# Patient Record
Sex: Female | Born: 1980 | Race: White | Hispanic: No | Marital: Married | Smoking: Current every day smoker
Health system: Southern US, Community
[De-identification: ages and names within clinical notes are randomized; demographics above are authoritative.]

## PROBLEM LIST (undated history)

## (undated) DIAGNOSIS — E0500 Thyrotoxicosis with diffuse goiter without thyrotoxic crisis or storm: Secondary | ICD-10-CM

## (undated) DIAGNOSIS — Z9689 Presence of other specified functional implants: Secondary | ICD-10-CM

## (undated) DIAGNOSIS — G40909 Epilepsy, unspecified, not intractable, without status epilepticus: Secondary | ICD-10-CM

## (undated) HISTORY — PX: TOE AMPUTATION: SHX809

---

## 2016-12-12 ENCOUNTER — Ambulatory Visit (INDEPENDENT_AMBULATORY_CARE_PROVIDER_SITE_OTHER): Payer: Worker's Compensation

## 2016-12-12 ENCOUNTER — Ambulatory Visit
Admission: EM | Admit: 2016-12-12 | Discharge: 2016-12-12 | Disposition: A | Discharge status: Home / Self Care | Payer: Worker's Compensation

## 2016-12-12 DIAGNOSIS — W228XXA Striking against or struck by other objects, initial encounter: Secondary | ICD-10-CM

## 2016-12-12 DIAGNOSIS — S9032XA Contusion of left foot, initial encounter: Secondary | ICD-10-CM

## 2016-12-12 HISTORY — DX: Epilepsy, unspecified, not intractable, without status epilepticus: G40.909

## 2016-12-12 HISTORY — DX: Presence of other specified functional implants: Z96.89

## 2016-12-12 HISTORY — DX: Thyrotoxicosis with diffuse goiter without thyrotoxic crisis or storm: E05.00

## 2016-12-12 MED ORDER — NAPROXEN 500 MG PO TABS: 500.0000 mg | ORAL_TABLET | Freq: Two times a day (BID) | ORAL | 0 refills | Status: AC

## 2016-12-12 NOTE — ED Provider Notes (Signed)
CSN: [id number]     Arrival date & time 12/12/16  1226 History   None    Chief Complaint  Patient presents with  . Foot Injury    left W/C   (Consider location/radiation/quality/duration/timing/severity/associated sxs/prior Treatment) HPI  This a 36 year old female who received 3 days prior to this visit dropped a pallet on her left foot dorsum. In the process of stacking empty pallets had the palate of noted when she lost control and it smashed onto her foot. Since that time she's had difficulty walking because of the pain in his walking mostly on her heel. 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      Past Medical History:  Diagnosis Date  . 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 Graves disease   . S/P placement of VNS (vagus nerve stimulation) device    Past Surgical History:  Procedure Laterality Date  . TOE AMPUTATION Left    Pinky   Family History  Problem Relation Age of Onset  . Thyroid disease Mother   . Alcohol abuse Mother    Social History  Substance Use Topics  . Smoking status: Current Every Day Smoker    Packs/day: 0.50    Types: Cigarettes  . Smokeless tobacco: Never Used  . Alcohol use No   OB History    No data available     Review of Systems  Constitutional: Positive for activity change. Negative for chills, fatigue and fever.  Musculoskeletal: Positive for myalgias.  All other systems reviewed and are negative.   Allergies  Zonegran [zonisamide] and Sulfa antibiotics  Home Medications   Prior to Admission medications   Medication Sig Start Date End Date Taking? Authorizing Provider  clonazePAM (KLONOPIN) 1 MG tablet Take 1 mg by mouth 2 (two) times daily.   Yes Historical Provider, MD  gabapentin (NEURONTIN) 400 MG capsule Take 400 mg by mouth at bedtime.   Yes Historical Provider, MD  QUEtiapine (SEROQUEL) 200 MG tablet Take 200 mg by mouth at bedtime.   Yes Historical Provider, MD  topiramate  (TOPAMAX) 25 MG tablet Take 25 mg by mouth 2 (two) times daily.   Yes Historical Provider, MD  naproxen (NAPROSYN) 500 MG tablet Take 1 tablet (500 mg total) by mouth 2 (two) times daily with a meal. 12/12/16   Lutricia Feil, PA-C   Meds Ordered and Administered this Visit  Medications - No data to display  BP 114/79 (BP Location: Left Arm)   Pulse 77   Temp 98 F (36.7 C) (Oral)   Resp 18   Ht  (1.727 m)   Wt 171 lb (77.6 kg)   LMP 11/25/2016   SpO2 100%   BMI 26.00 kg/m  No data found.   Physical Exam  Constitutional: She is oriented to person, place, and time. She appears well-developed and well-nourished. No distress.  HENT:  Head: Normocephalic and atraumatic.  Eyes: Pupils are equal, round, and reactive to light.  Neck: Normal range of motion.  Musculoskeletal: She exhibits tenderness. She exhibits no edema or deformity.  Examination of the left foot shows no ecchymosis no swelling or. There is some small abrasion over the midportion of the first metatarsal. There is no induration and no erythema in this area. She has a surgically absent fifth toe. She has full range of motion of her ankle. She has no calcaneal pressure pain. This is sharply localized over the first  metatarsal from the midshaft distally MTP head.  Neurological: She is alert and oriented to person, place, and time.  Skin: Skin is warm and dry. She is not diaphoretic.  Psychiatric: She has a normal mood and affect. Her behavior is normal. Judgment and thought content normal.  Nursing note and vitals reviewed.   Urgent Care Course     Procedures (including critical care time)  Labs Review Labs Reviewed - No data to display  Imaging Review Dg Foot Complete Left  Result Date: 12/12/2016 CLINICAL DATA:  Dropped wooden Pallet from 4 foot height onto foot. Left foot pain and swelling. Initial encounter. EXAM: LEFT FOOT - COMPLETE 3+ VIEW COMPARISON:  None. FINDINGS: There is no evidence of fracture or  dislocation. There is no evidence of arthropathy or other focal bone lesions. 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 Small plantar calcaneal bone spur incidentally noted. Soft tissues are unremarkable. IMPRESSION: No acute findings.  Previous fifth toe amputation. Electronically Signed   By: Myles Rosenthal M.D.   On: 12/12/2016 13:07     Visual Acuity Review  Right Eye Distance:   Left Eye Distance:   Bilateral Distance:    Right Eye Near:   Left Eye Near:    Bilateral Near:         MDM   1. Contusion of left foot, initial encounter    Discharge Medication List as of 12/12/2016  1:19 PM    START taking these medications   Details  naproxen (NAPROSYN) 500 MG tablet Take 1 tablet (500 mg total) by mouth 2 (two) times daily with a meal., Starting Sun 12/12/2016, Normal      Plan: 1. Test/x-ray results and diagnosis reviewed with patient 2. rx as per orders; risks, benefits, potential side effects reviewed with patient 3. Recommend supportive treatment with Rest and symptom avoidance. Elevate the foot is much as feasible. Continue to use ice 20 minutes out of every 2 hours 3-4 times daily. Use of the boot orthosis for activities requiring ambulation or standing. May remove the boot at nighttime. Follow up with orthopedic surgeon if not improving.  4. F/u prn if symptoms worsen or don't improve     Lutricia Feil, PA-C 12/12/16 1330

## 2016-12-12 NOTE — ED Triage Notes (Signed)
Pt dropped a pallet on her left foot on  Thursday morning.

## 2016-12-29 ENCOUNTER — Encounter: Payer: Self-pay | Admitting: Podiatry

## 2016-12-29 ENCOUNTER — Ambulatory Visit: Payer: Managed Care, Other (non HMO)

## 2016-12-29 ENCOUNTER — Ambulatory Visit (INDEPENDENT_AMBULATORY_CARE_PROVIDER_SITE_OTHER): Payer: Worker's Compensation | Admitting: Podiatry

## 2016-12-29 DIAGNOSIS — S99922A Unspecified injury of left foot, initial encounter: Secondary | ICD-10-CM

## 2016-12-29 DIAGNOSIS — R203 Hyperesthesia: Secondary | ICD-10-CM

## 2016-12-30 NOTE — Progress Notes (Signed)
   HPI:  36 year old f49emale presents to the office today for evaluation of left great toe pain. Patient states that on 12/09/2016 she had a palate fall on her foot. Patient had x-rays done at the emergency department which were negative for fracture. Immobilization cam boot provided. Patient cannot stand for long periods of time without the cam boot. Patient presents today for follow-up treatment and evaluation    Physical Exam: General: The patient is alert and oriented x3 in no acute distress.  Dermatology: Hyperesthesia noted to light touch along the first metatarsal phalangeal joint and distal half of the first metatarsal. Negative for open lacerations or lesions.  Vascular: Palpable pedal pulses bilaterally. No edema or erythema noted. Capillary refill within normal limits.  Neurological: Hyperesthesia noted to light touch along the first metatarsal phalangeal joint left foot  Musculoskeletal Exam: Unable to perform musculoskeletal exam and range of motion of the first MPJ due to hypersensitivity  Radiographic Exam:  Normal osseous mineralization. Joint spaces preserved. No fracture/dislocation/boney destruction.    Assessment: 1. Hyperesthesia left great toe 2. History of trauma left great toe   Plan of Care:  1. Patient was evaluated. 2. Due to the hypersensitivity and pain today were going to order a CT with contrast. Patient is unable to have an MRI performed due to vagal nerve stimulator. 3. Return to clinic in 3 weeks to review CT results and discuss further treatment options. We may need to rule out CRPS. The pain appears to be disproportionate to the history of trauma and clinical appearance of the foot.   Felecia Shelling, DPM [facility]  Dr. Felecia Shelling, DPM    [address]                                        [place], [place] [address]                Office [phone]  Fax [phone]

## 2016-12-31 ENCOUNTER — Telehealth: Payer: Self-pay | Admitting: *Deleted

## 2016-12-31 DIAGNOSIS — S99922A Unspecified injury of left foot, initial encounter: Secondary | ICD-10-CM

## 2016-12-31 DIAGNOSIS — R203 Hyperesthesia: Secondary | ICD-10-CM

## 2016-12-31 NOTE — Telephone Encounter (Addendum)
-----   Message from Felecia Shelling, DPM sent at 12/30/2016  4:48 PM EDT ----- Regarding: CT w/ contrast Please order CT with contrast left foot. Patient is unable to have an MRI due to a vagal nerve stimulator.  Diagnoses: Hyperesthesia left great toe. History of trauma left great toe. Pain out of proportion to clinical findings left foot.  Thanks, Dr. Logan Bores.12/31/2016-Contacted pt's Workers' Physicist, medical [employer] c/o Linden A. Rodney Langton., answering service gave fax [phone]. Faxed copy of pt's Workers Compensation - First Report of Injury or Illness form, clinical 12/29/2016, with cover sheet with [phone], and fax [phone] Attn: Joya San.01/04/2017-Pt left message but was difficult to understand what she was wanting. Left message to call again. Pt called again and states needs updated restrictions for form. 01/05/2017-Dr. Earlean Shawl [phone] states he needs a Peer to Peer with Dr. Logan Bores concerning pt's CT with contrast. Pt left name and phone number only. 01/06/2017-I spoke with pt and she stated she spoke with someone and they were looking at getting Dr.Evans to write a note with updated restrictions. I told her that if she left a message again to let me know what she wanted and I could get that sent to the person that took care of those request. 01/06/2017-Amy - Henrene Dodge state pt has been certified - Reference# [id number] for CT with Contrast, and certified - Reference# [id number] for 3 week follow up appt, and pt's Workers Comp. Adjuster Tyler Aas [phone]. Left message informing Tyler Aas, our office had received the certifications for the CT and the follow-up visit, but would like to know if her agency had a radiology group they preferred to use, to please call so I could order the CT. [employer] risk analyst, Adjuster called states pt's Claim# [id number]/07/2017-I spoke with Tyler Aas, Adjuster and she states they use [facility] for radiology  orders [phone], will need pt's SS# [id number], pt's DOB and Adjuster's name. Faxed orders to [facility] for CT with contrast of left great toe.01/10/2017-Mark - [facility] states pt is scheduled for CT on 01/14/2017.01/11/2017-Received fax requesting orders for CT to be faxed to [facility], [address] [place], [place] [address], [phone], f [phone]. Copy of orders faxed to [facility].

## 2017-01-06 ENCOUNTER — Encounter: Payer: Self-pay | Admitting: Podiatry

## 2017-01-25 ENCOUNTER — Ambulatory Visit (INDEPENDENT_AMBULATORY_CARE_PROVIDER_SITE_OTHER): Payer: Worker's Compensation | Admitting: Podiatry

## 2017-01-25 DIAGNOSIS — M7752 Other enthesopathy of left foot: Secondary | ICD-10-CM | POA: Diagnosis not present

## 2017-01-25 MED ORDER — METHYLPREDNISOLONE 4 MG PO TBPK: ORAL_TABLET | ORAL | 0 refills | Status: AC

## 2017-01-25 MED ORDER — DICLOFENAC SODIUM 75 MG PO TBEC: 75.0000 mg | DELAYED_RELEASE_TABLET | Freq: Two times a day (BID) | ORAL | 0 refills | Status: AC

## 2017-01-26 NOTE — Progress Notes (Signed)
   HPI:  36 year old female presents to the office today for follow up evaluation of left great toe pain. She states her pain is the same. She denies any new trauma, injury or fall. She has no new complaints.   Physical Exam: General: The patient is alert and oriented x3 in no acute distress.  Dermatology: Hyperesthesia noted to light touch along the first metatarsal phalangeal joint and distal half of the first metatarsal. Negative for open lacerations or lesions.  Vascular: Palpable pedal pulses bilaterally. No edema or erythema noted. Capillary refill within normal limits.  Neurological: Hyperesthesia noted to light touch along the first metatarsal phalangeal joint left foot  Musculoskeletal Exam: Today there is pain on palpation and range of motion of the first MTPJ left foot consistent with a joint capsulitis.  Assessment: 1. Capsulitis 1st MPJ left   Plan of Care:  1. Patient was evaluated. CT scan reviewed.  2. Injection of 0.5 mLs Celestone Soluspan injected into 1st MPJ of the left foot. 3. Dancer's pads dispensed.  4. Continue wearing post op shoe. 5. Prescription for Medrol Dose Pak given to patient. 6. Prescription for Diclofenac 75 mg given to patient. 7. Return to clinic in 4 weeks.   Felecia ShellingBrent M. Ernesha Ramone, DPM [facility]  Dr. Felecia ShellingBrent M. Terrell Ostrand, DPM    [address]                                        [place], [place] [address]                Office [phone]  Fax [phone]

## 2017-01-28 MED ORDER — BETAMETHASONE SOD PHOS & ACET 6 (3-3) MG/ML IJ SUSP: 3.0000 mg | Freq: Once | INTRAMUSCULAR | Status: AC

## 2017-02-08 ENCOUNTER — Encounter: Payer: Self-pay | Admitting: Podiatry

## 2017-02-25 ENCOUNTER — Ambulatory Visit (INDEPENDENT_AMBULATORY_CARE_PROVIDER_SITE_OTHER): Payer: Worker's Compensation | Admitting: Podiatry

## 2017-02-25 DIAGNOSIS — M2580 Other specified joint disorders, unspecified joint: Secondary | ICD-10-CM | POA: Diagnosis not present

## 2017-02-25 DIAGNOSIS — M7752 Other enthesopathy of left foot: Secondary | ICD-10-CM | POA: Diagnosis not present

## 2017-03-01 NOTE — Progress Notes (Signed)
   HPI:  36 year old female presents to the office today for follow up evaluation of left great toe pain. She states her pain has improved but still reports some pain depending on the shoes she is wearing. She has been taking diclofenac which helps alleviate the pain. She reports significant improvement after receiving the injection. She denies any new complaints at this time.   Physical Exam: General: The patient is alert and oriented x3 in no acute distress.  Dermatology: Hyperesthesia noted to light touch along the first metatarsal phalangeal joint and distal half of the first metatarsal. Negative for open lacerations or lesions.  Vascular: Palpable pedal pulses bilaterally. No edema or erythema noted. Capillary refill within normal limits.  Neurological: Hyperesthesia noted to light touch along the first metatarsal phalangeal joint left foot  Musculoskeletal Exam: Today there is pain on palpation and range of motion of the first MTPJ left foot consistent with a joint capsulitis.  Assessment: 1. Capsulitis 1st MPJ left 2. Sesamoiditis left   Plan of Care:  1. Patient was evaluated. 2. Injection of 0.5 mLs Celestone Soluspan injected into 1st MPJ of the left foot. 3. Appointment with Raiford Nobleick for custom molded orthotics with Morton's extension. 4. Return to clinic in 6 weeks.   Felecia ShellingBrent M. Claudina Oliphant, DPM [facility]  Dr. Felecia ShellingBrent M. Hilari Wethington, DPM    [address]                                        [place], [place] [address]                Office [phone]  Fax [phone]

## 2017-03-03 MED ORDER — BETAMETHASONE SOD PHOS & ACET 6 (3-3) MG/ML IJ SUSP: 3.0000 mg | Freq: Once | INTRAMUSCULAR | Status: AC

## 2017-03-23 ENCOUNTER — Ambulatory Visit: Payer: Self-pay

## 2017-12-28 IMAGING — CR DG FOOT COMPLETE 3+V*L*
3 series · 3 of 3 positions shown · non-contrast
Comparison: None.

CLINICAL DATA: Dropped Cho Con Pallet from 4 foot height onto foot.
Left foot pain and swelling. Initial encounter.

EXAM:
LEFT FOOT - COMPLETE 3+ VIEW

[foot ap]
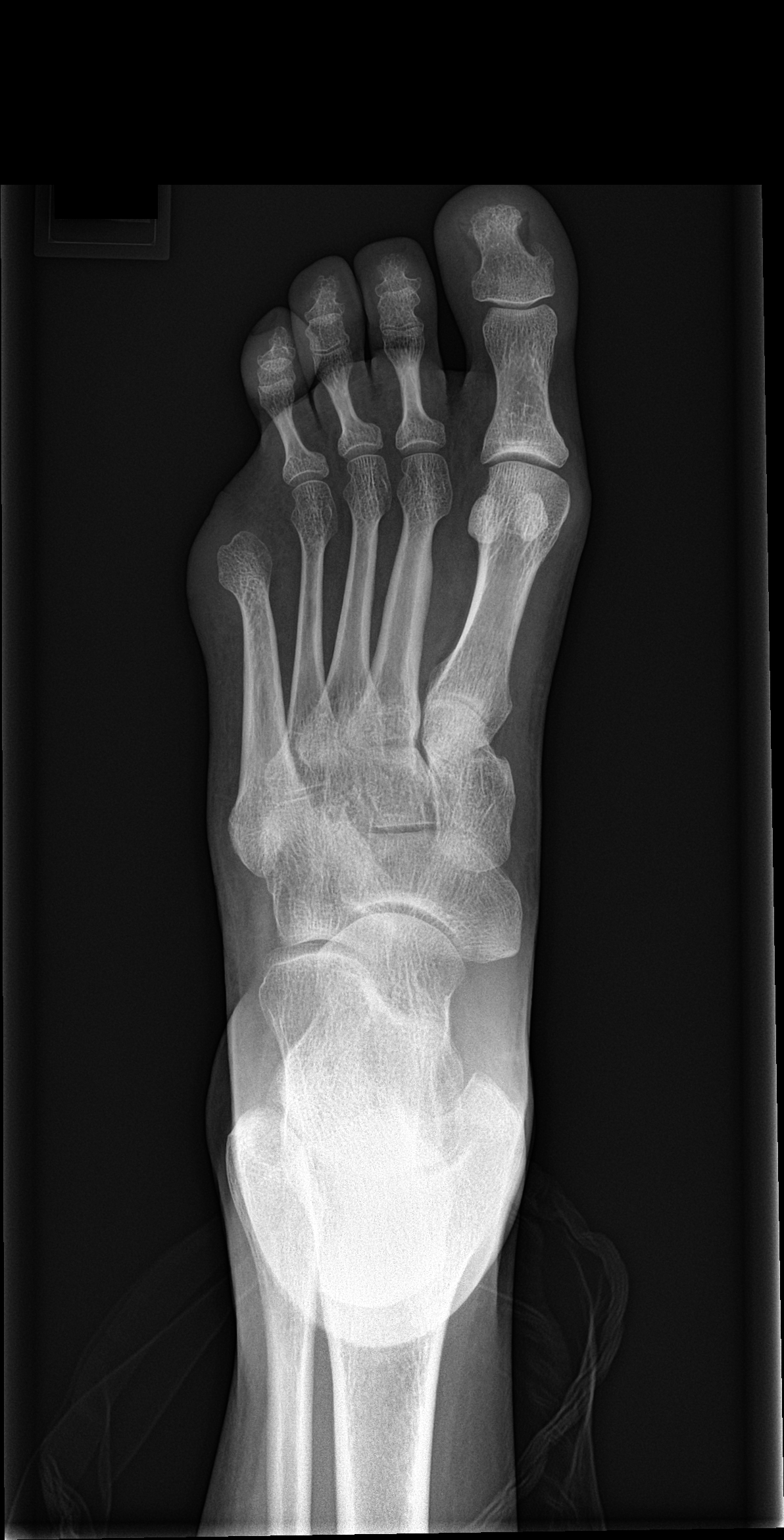

[foot obl]
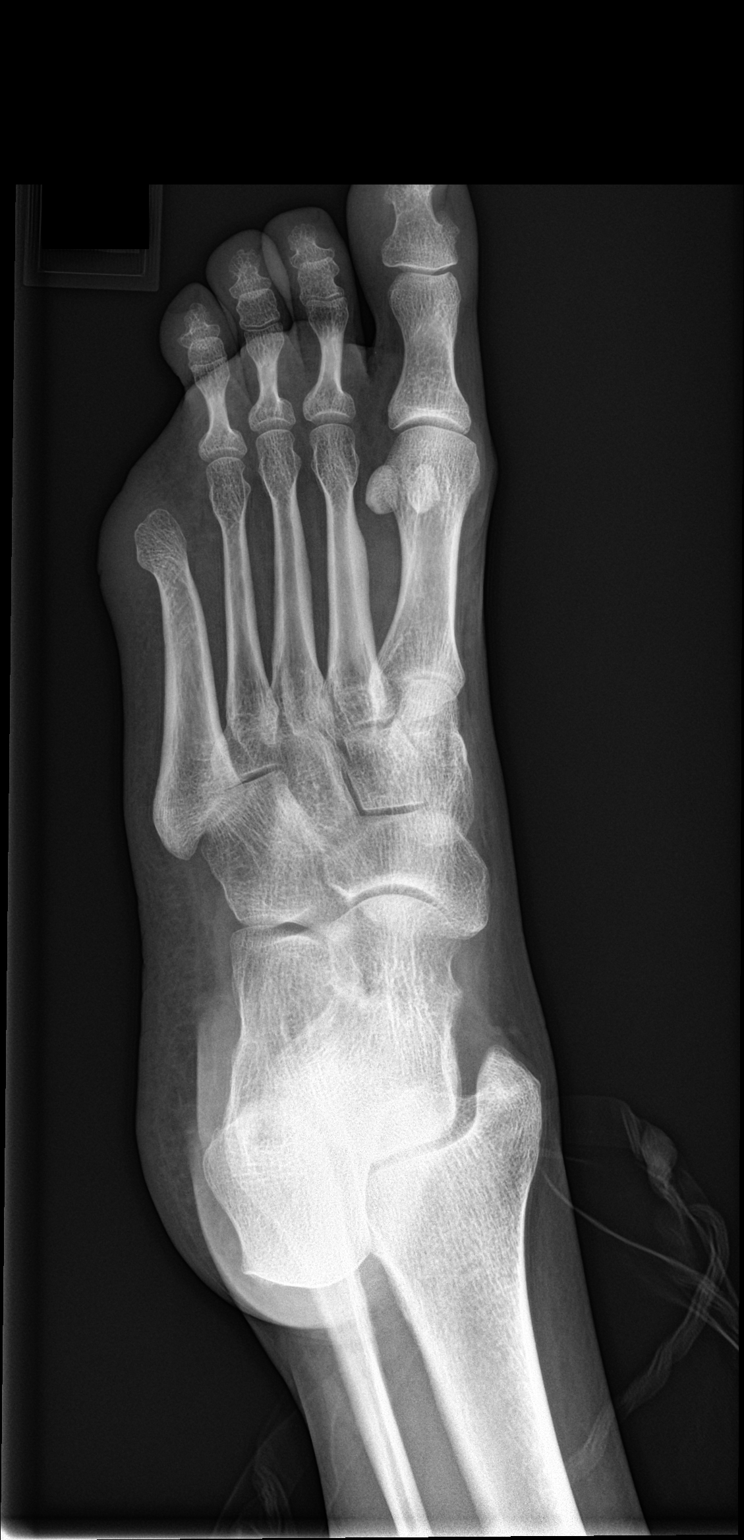

[foot lat]
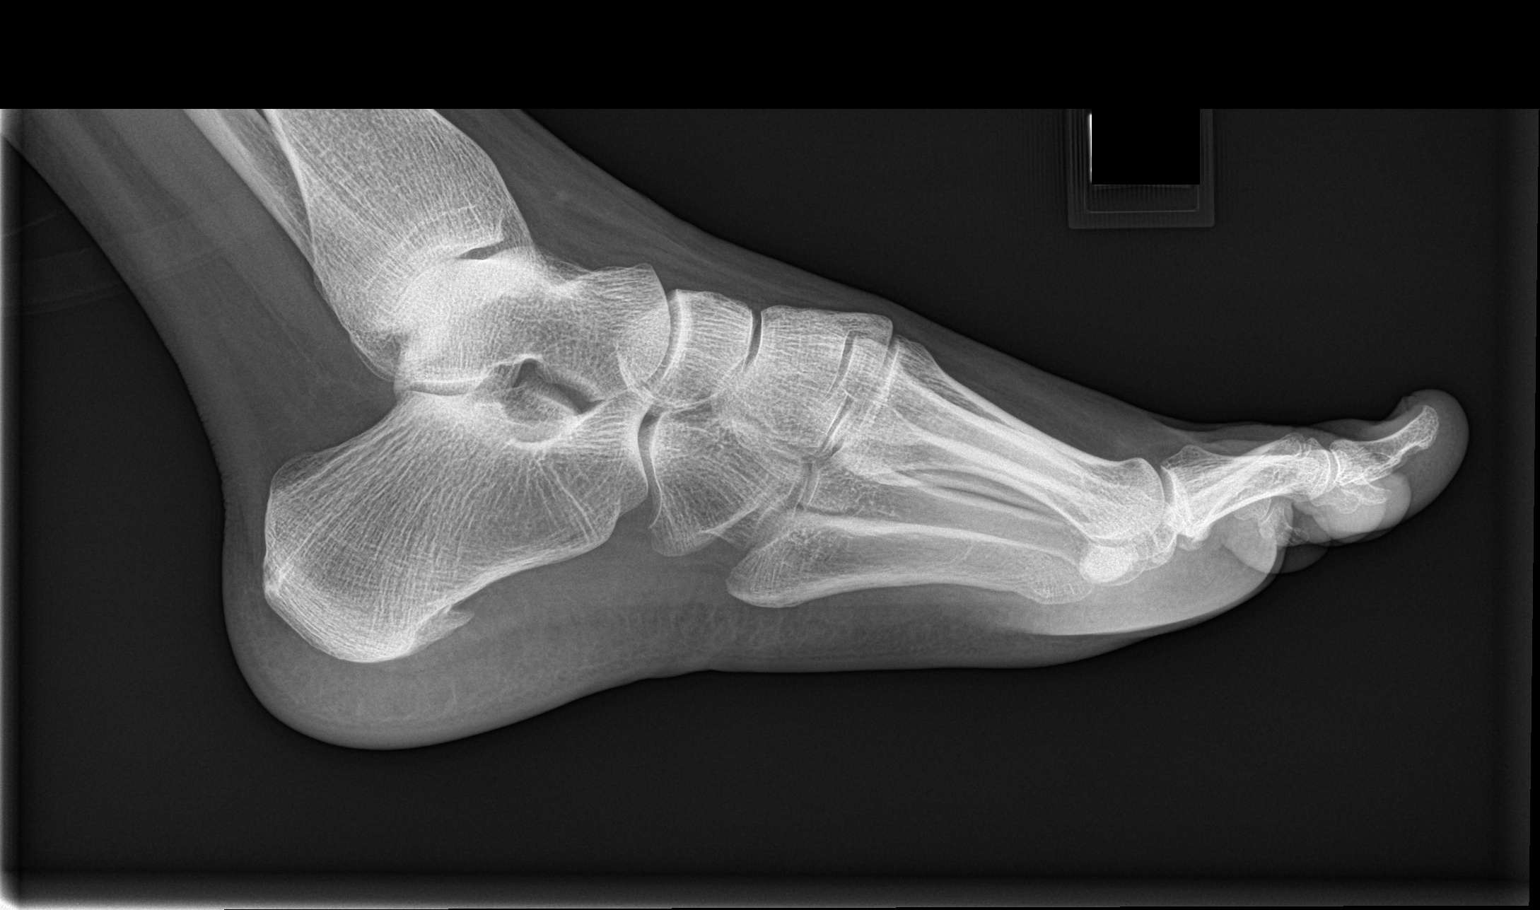

[3 of 3 positions shown; findings below may reference images not displayed]

FINDINGS: There is no evidence of fracture or dislocation. There is no
evidence of arthropathy or other focal bone lesions. Previous
amputation of the fifth toe is seen at the MTP joint. Small plantar
calcaneal bone spur incidentally noted. Soft tissues are
unremarkable.
IMPRESSION: No acute findings.  Previous fifth toe amputation.

## 2019-12-15 ENCOUNTER — Ambulatory Visit: Payer: Self-pay | Attending: Internal Medicine
